# Patient Record
Sex: Female | Born: 2005 | Race: White | Hispanic: No | Marital: Single | Smoking: Never smoker
Health system: Southern US, Community
[De-identification: ages and names within clinical notes are randomized; demographics above are authoritative.]

## PROBLEM LIST (undated history)

## (undated) DIAGNOSIS — F419 Anxiety disorder, unspecified: Secondary | ICD-10-CM

## (undated) DIAGNOSIS — F32A Depression, unspecified: Secondary | ICD-10-CM

## (undated) DIAGNOSIS — F09 Unspecified mental disorder due to known physiological condition: Secondary | ICD-10-CM

## (undated) HISTORY — DX: Depression, unspecified: F32.A

## (undated) HISTORY — DX: Unspecified mental disorder due to known physiological condition: F09

## (undated) HISTORY — DX: Anxiety disorder, unspecified: F41.9

---
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
## 2024-01-14 ENCOUNTER — Ambulatory Visit: Admitting: Pediatrics

## 2024-01-18 LAB — LAB REPORT - SCANNED
EGFR (Non-African Amer.): 134
TSH: 1.6

## 2024-01-23 ENCOUNTER — Telehealth: Payer: Self-pay

## 2024-01-23 NOTE — Telephone Encounter (Signed)
 Copied from CRM #[id number]. Topic: Clinical - Medical Advice >> Jan 23, 2024 12:12 PM Montie POUR wrote: Reason for CRM:  Iola is seeing a new psych doctor and they completed blood work. Mom wants to discuss the results with Dr. Herold. Please call her at [phone]. Laquia also needs to come in for a B 12 shot and mom wants to discuss this with Dr. Herold.

## 2024-01-24 NOTE — Telephone Encounter (Signed)
 Called and spoke with patient informed her that we haven't received labs pt informed me that she will request to get them sent over to us  scheduled patient 8/15 with provider to discuss labs and concerns

## 2024-01-27 ENCOUNTER — Other Ambulatory Visit: Payer: Self-pay

## 2024-01-30 ENCOUNTER — Ambulatory Visit

## 2024-01-30 ENCOUNTER — Encounter: Payer: Self-pay | Admitting: Pediatrics

## 2024-01-30 ENCOUNTER — Ambulatory Visit: Admitting: Pediatrics

## 2024-01-30 VITALS — BP 96/64 | HR 111 | Temp 98.1°F | Wt 138.2 lb

## 2024-01-30 DIAGNOSIS — Z113 Encounter for screening for infections with a predominantly sexual mode of transmission: Secondary | ICD-10-CM

## 2024-01-30 DIAGNOSIS — F4310 Post-traumatic stress disorder, unspecified: Secondary | ICD-10-CM | POA: Diagnosis not present

## 2024-01-30 DIAGNOSIS — L709 Acne, unspecified: Secondary | ICD-10-CM

## 2024-01-30 DIAGNOSIS — E538 Deficiency of other specified B group vitamins: Secondary | ICD-10-CM

## 2024-01-30 DIAGNOSIS — G4700 Insomnia, unspecified: Secondary | ICD-10-CM | POA: Diagnosis not present

## 2024-01-30 DIAGNOSIS — F909 Attention-deficit hyperactivity disorder, unspecified type: Secondary | ICD-10-CM

## 2024-01-30 LAB — WET PREP FOR TRICH, YEAST, CLUE
Clue Cell Exam: POSITIVE — AB
Trichomonas Exam: NEGATIVE
Yeast Exam: NEGATIVE

## 2024-01-30 MED ORDER — ESZOPICLONE 3 MG PO TABS: 3.0000 mg | ORAL_TABLET | Freq: Every day | ORAL | 2 refills | Status: DC

## 2024-01-30 MED ORDER — CYANOCOBALAMIN 1000 MCG/ML IJ SOLN
1000.0000 ug | INTRAMUSCULAR | Status: AC
  Administered 2024-01-30 – 2024-02-13 (×2): 1000 ug via INTRAMUSCULAR

## 2024-01-30 NOTE — Progress Notes (Unsigned)
   Office Visit  BP 96/64   Pulse (!) 111   Temp 98.1 F (36.7 C) (Oral)   Wt 138 lb 3.2 oz (62.7 kg)   SpO2 97%   BMI 23.72 kg/m    Subjective:    Patient ID: Elizabeth Farrell, female    DOB: September 30, 2005, 18 y.o.   MRN: [id number]  HPI: Elizabeth Farrell is a 18 y.o. female  Chief Complaint  Patient presents with  . ADHD    Discussed the use of AI scribe software for clinical note transcription with the patient, who gave verbal consent to proceed.  History of Present Illness     Relevant past medical, surgical, family and social history reviewed and updated as indicated. Interim medical history since our last visit reviewed. Allergies and medications reviewed and updated.  ROS per HPI unless specifically indicated above     Objective:    BP 96/64   Pulse (!) 111   Temp 98.1 F (36.7 C) (Oral)   Wt 138 lb 3.2 oz (62.7 kg)   SpO2 97%   BMI 23.72 kg/m   Wt Readings from Last 3 Encounters:  01/30/24 138 lb 3.2 oz (62.7 kg) (71%, Z= 0.54)*  11/14/23 122 lb (55.3 kg) (44%, Z= -0.16)*  10/24/23 121 lb 12.8 oz (55.2 kg) (43%, Z= -0.17)*   * Growth percentiles are based on CDC (Girls, 2-20 Years) data.     Physical Exam      01/30/2024    8:24 AM 11/14/2023    3:34 PM 10/24/2023    2:03 PM 09/03/2023    3:54 PM 08/15/2023    3:15 PM  Depression screen PHQ 2/9  Decreased Interest 2 2 2 1 2   Down, Depressed, Hopeless 2 1 2 1 2   PHQ - 2 Score 4 3 4 2 4   Altered sleeping 3 3 3 3 3   Tired, decreased energy 3 2 3 2 3   Change in appetite 2 2 1 1 3   Feeling bad or failure about yourself  1 1 1 2 2   Trouble concentrating 2 3 3 3 3   Moving slowly or fidgety/restless 0 0 0 0 0  Suicidal thoughts 0 0 0 0 0  PHQ-9 Score 15 14 15 13 18   Difficult doing work/chores Very difficult Extremely dIfficult Very difficult Very difficult        01/30/2024    8:24 AM 11/14/2023    3:34 PM 10/24/2023    2:03 PM 09/03/2023    3:55 PM  GAD 7 : Generalized Anxiety Score   Nervous, Anxious, on Edge 2 2 2 3   Control/stop worrying 3 2 2 3   Worry too much - different things 3 2 3 3   Trouble relaxing 3 3 2 3   Restless 2 2 3 3   Easily annoyed or irritable 2 3 3 3   Afraid - awful might happen 2 3 2 3   Total GAD 7 Score 17 17 17 21   Anxiety Difficulty Very difficult Extremely difficult Extremely difficult Extremely difficult       Assessment & Plan:  Assessment & Plan   Attention deficit hyperactivity disorder (ADHD), unspecified ADHD type     Assessment and Plan Assessment & Plan      Follow up plan: No follow-ups on file.  Hadassah SHAUNNA Nett, MD

## 2024-01-31 ENCOUNTER — Ambulatory Visit: Payer: Self-pay | Admitting: Pediatrics

## 2024-01-31 ENCOUNTER — Ambulatory Visit: Admitting: Pediatrics

## 2024-01-31 ENCOUNTER — Other Ambulatory Visit: Payer: Self-pay | Admitting: Pediatrics

## 2024-01-31 DIAGNOSIS — B9689 Other specified bacterial agents as the cause of diseases classified elsewhere: Secondary | ICD-10-CM

## 2024-01-31 DIAGNOSIS — B379 Candidiasis, unspecified: Secondary | ICD-10-CM

## 2024-01-31 MED ORDER — METRONIDAZOLE 500 MG PO TABS: 500.0000 mg | ORAL_TABLET | Freq: Two times a day (BID) | ORAL | 0 refills | Status: AC

## 2024-01-31 MED ORDER — FLUCONAZOLE 150 MG PO TABS: 150.0000 mg | ORAL_TABLET | Freq: Once | ORAL | 0 refills | Status: AC

## 2024-02-01 LAB — CHLAMYDIA/GONOCOCCUS/TRICHOMONAS, NAA
Chlamydia by NAA: NEGATIVE
Gonococcus by NAA: NEGATIVE
Trich vag by NAA: NEGATIVE

## 2024-02-04 ENCOUNTER — Encounter: Payer: Self-pay | Admitting: Pediatrics

## 2024-02-04 DIAGNOSIS — E538 Deficiency of other specified B group vitamins: Secondary | ICD-10-CM | POA: Insufficient documentation

## 2024-02-04 DIAGNOSIS — L709 Acne, unspecified: Secondary | ICD-10-CM | POA: Insufficient documentation

## 2024-02-04 NOTE — Assessment & Plan Note (Signed)
 New onset acne, possibly hormonal or related to weight gain. Benzoyl peroxide causing dryness. - Prescribe clindamycin topical. - Recommend gentle cleanser. - Monitor and adjust treatment.

## 2024-02-04 NOTE — Assessment & Plan Note (Signed)
 On Wellbutrin, helps with appetite and energy. Considering dose increase but causes elevated heart rate and dry mouth. She would like a second opinion about a about psychiatrist's approach and potential for ADHD, BPD, or bipolar disorder. Lexapro discussed as addition. Place consult for second opinion. - Discuss Wellbutrin dose increase with psychiatrist. - Consider adding Lexapro.

## 2024-02-04 NOTE — Assessment & Plan Note (Signed)
 Low B12 levels potentially affecting energy and cognition. No anemia present. - Administer B12 injections weekly, then monthly for two months, then every three months. - Monitor B12 levels every 6-8 weeks. - Offer oral B12 supplements if injections not preferred.

## 2024-02-04 NOTE — Assessment & Plan Note (Signed)
 Difficulty sleeping and anxiety upon waking. Lunesta  effective previously. Psychiatrist managing one medication at a time. - Prescribe Lunesta  for sleep.

## 2024-02-05 NOTE — Telephone Encounter (Signed)
 noted

## 2024-02-06 ENCOUNTER — Ambulatory Visit

## 2024-02-07 ENCOUNTER — Encounter: Payer: Self-pay | Admitting: Psychiatry

## 2024-02-12 ENCOUNTER — Ambulatory Visit: Admitting: Pediatrics

## 2024-02-13 ENCOUNTER — Other Ambulatory Visit: Payer: Self-pay | Admitting: Pediatrics

## 2024-02-13 ENCOUNTER — Ambulatory Visit

## 2024-02-13 ENCOUNTER — Telehealth: Payer: Self-pay | Admitting: Pediatrics

## 2024-02-13 DIAGNOSIS — E538 Deficiency of other specified B group vitamins: Secondary | ICD-10-CM | POA: Diagnosis not present

## 2024-02-13 DIAGNOSIS — G4700 Insomnia, unspecified: Secondary | ICD-10-CM

## 2024-02-13 NOTE — Telephone Encounter (Signed)
 Prescription Request  02/13/2024  LOV: 01/30/2024  What is the name of the medication?  Eszopiclone  3 MG TABS   Have you contacted your pharmacy to request a refill? No   Which pharmacy would you like this sent to?  [id number] - [place], [place] - [address] [place] [address] Phone: [phone] Fax: [phone]    Patient notified that their request is being sent to the clinical staff for review and that they should receive a response within 2 business days.   Please advise at Mobile [phone] (mobile)   Patient states she has been taking 2 pills and need refills.

## 2024-02-14 ENCOUNTER — Ambulatory Visit: Admitting: Pediatrics

## 2024-02-21 ENCOUNTER — Other Ambulatory Visit: Payer: Self-pay | Admitting: Pediatrics

## 2024-02-21 DIAGNOSIS — B3731 Acute candidiasis of vulva and vagina: Secondary | ICD-10-CM

## 2024-02-21 MED ORDER — FLUCONAZOLE 150 MG PO TABS: 150.0000 mg | ORAL_TABLET | Freq: Once | ORAL | 0 refills | Status: AC

## 2024-02-21 MED ORDER — ESZOPICLONE 3 MG PO TABS: 3.0000 mg | ORAL_TABLET | Freq: Every day | ORAL | 0 refills | Status: DC

## 2024-02-21 NOTE — Progress Notes (Signed)
 Requesting diflucan  for after abx, sent.  Elizabeth SHAUNNA Nett, MD

## 2024-02-21 NOTE — Progress Notes (Signed)
 Sending lunesta  3mg , early per PDMP but patient misunderstood dosing change and ran out earlier. Will review this further at upcoming visit.  Hadassah SHAUNNA Nett, MD

## 2024-02-26 ENCOUNTER — Ambulatory Visit: Admitting: Nurse Practitioner

## 2024-02-26 ENCOUNTER — Encounter: Payer: Self-pay | Admitting: Nurse Practitioner

## 2024-02-26 VITALS — BP 128/84 | HR 119 | Temp 98.4°F | Wt 136.2 lb

## 2024-02-26 DIAGNOSIS — R0981 Nasal congestion: Secondary | ICD-10-CM

## 2024-02-26 DIAGNOSIS — J069 Acute upper respiratory infection, unspecified: Secondary | ICD-10-CM

## 2024-02-26 DIAGNOSIS — R509 Fever, unspecified: Secondary | ICD-10-CM | POA: Diagnosis not present

## 2024-02-26 NOTE — Progress Notes (Signed)
 BP 128/84   Pulse (!) 119   Temp 98.4 F (36.9 C) (Oral)   Wt 136 lb 3.2 oz (61.8 kg)   SpO2 98%   BMI 23.38 kg/m    Subjective:    Patient ID: Elizabeth Farrell, female    DOB: 02-Jan-2006, 18 y.o.   MRN: [id number]  HPI: Elizabeth Farrell is a 18 y.o. female  Chief Complaint  Patient presents with   URI    Patient states she has been having a fever, congestion, headache, sore throat, cough, and body aches since Sunday evening, Monday morning.    UPPER RESPIRATORY TRACT INFECTION Worst symptom: started Sunday night. Worsened on Monday Fever: Monday 102 and yesterday and 99.3 Cough: yes Shortness of breath: yes Wheezing: yes Chest pain: yes Chest tightness: yes Chest congestion: yes Nasal congestion: yes Runny nose: yes Post nasal drip: yes Sneezing: yes Sore throat: yes Swollen glands: no Sinus pressure: yes Headache: yes Face pain: yes Toothache: no Ear pain: yes bilateral Ear pressure: no bilateral Eyes red/itching:no Eye drainage/crusting: no  Vomiting: no Rash: no Fatigue: yes Sick contacts: yes Strep contacts: no  Context: stable Recurrent sinusitis: no Relief with OTC cold/cough medications: yes  Treatments attempted: cold/sinus and mucinex and tylenol   Relevant past medical, surgical, family and social history reviewed and updated as indicated. Interim medical history since our last visit reviewed. Allergies and medications reviewed and updated.  Review of Systems  Constitutional:  Positive for fatigue and fever.  HENT:  Positive for congestion, postnasal drip, rhinorrhea, sinus pressure, sinus pain, sneezing and sore throat. Negative for dental problem and ear pain.   Respiratory:  Positive for cough, shortness of breath and wheezing.   Cardiovascular:  Positive for chest pain.  Gastrointestinal:  Negative for vomiting.  Skin:  Negative for rash.  Neurological:  Positive for headaches.    Per HPI unless specifically indicated above      Objective:    BP 128/84   Pulse (!) 119   Temp 98.4 F (36.9 C) (Oral)   Wt 136 lb 3.2 oz (61.8 kg)   SpO2 98%   BMI 23.38 kg/m   Wt Readings from Last 3 Encounters:  02/26/24 136 lb 3.2 oz (61.8 kg) (67%, Z= 0.45)*  01/30/24 138 lb 3.2 oz (62.7 kg) (71%, Z= 0.54)*  11/14/23 122 lb (55.3 kg) (44%, Z= -0.16)*   * Growth percentiles are based on CDC (Girls, 2-20 Years) data.    Physical Exam Vitals and nursing note reviewed.  Constitutional:      General: She is not in acute distress.    Appearance: Normal appearance. She is normal weight. She is not ill-appearing, toxic-appearing or diaphoretic.  HENT:     Head: Normocephalic.     Right Ear: External ear normal. A middle ear effusion is present.     Left Ear: External ear normal. A middle ear effusion is present.     Nose: Congestion and rhinorrhea present.     Mouth/Throat:     Mouth: Mucous membranes are moist.     Pharynx: Oropharynx is clear. Posterior oropharyngeal erythema present. No oropharyngeal exudate.  Eyes:     General:        Right eye: No discharge.        Left eye: No discharge.     Extraocular Movements: Extraocular movements intact.     Conjunctiva/sclera: Conjunctivae normal.     Pupils: Pupils are equal, round, and reactive to light.  Cardiovascular:  Rate and Rhythm: Normal rate and regular rhythm.     Heart sounds: No murmur heard. Pulmonary:     Effort: Pulmonary effort is normal. No respiratory distress.     Breath sounds: Normal breath sounds. No wheezing or rales.  Musculoskeletal:     Cervical back: Normal range of motion and neck supple.  Skin:    General: Skin is warm and dry.     Capillary Refill: Capillary refill takes less than 2 seconds.  Neurological:     General: No focal deficit present.     Mental Status: She is alert and oriented to person, place, and time. Mental status is at baseline.  Psychiatric:        Mood and Affect: Mood normal.        Behavior: Behavior normal.         Thought Content: Thought content normal.        Judgment: Judgment normal.     Results for orders placed or performed in visit on 02/07/24  Lab report - scanned   Collection Time: 01/18/24  1:49 PM  Result Value Ref Range   EGFR (Non-African Amer.) 134    TSH 1.600       Assessment & Plan:   Problem List Items Addressed This Visit   None Visit Diagnoses       Viral upper respiratory tract infection    -  Primary   Recommend contuing with over the counter treatment. Making sure to rest and stay well hydrated.  Follow up if symptoms are not improved.     Congestion of nasal sinus       Relevant Orders   Rapid Strep screen([facility])   Influenza A & B (STAT)   Novel Coronavirus, NAA ([facility])     Fever, unspecified fever cause       Relevant Orders   Rapid Strep screen([facility])   Influenza A & B (STAT)   Novel Coronavirus, NAA ([facility])        Follow up plan: No follow-ups on file.

## 2024-02-27 ENCOUNTER — Ambulatory Visit: Payer: Self-pay | Admitting: Nurse Practitioner

## 2024-02-28 ENCOUNTER — Ambulatory Visit: Admitting: Pediatrics

## 2024-02-29 LAB — NOVEL CORONAVIRUS, NAA: SARS-CoV-2, NAA: NOT DETECTED

## 2024-02-29 LAB — CULTURE, GROUP A STREP

## 2024-02-29 LAB — VERITOR FLU A/B WAIVED
Influenza A: NEGATIVE
Influenza B: NEGATIVE

## 2024-02-29 LAB — RAPID STREP SCREEN (MED CTR MEBANE ONLY): Strep Gp A Ag, IA W/Reflex: NEGATIVE

## 2024-02-29 NOTE — Patient Instructions (Incomplete)

## 2024-03-03 ENCOUNTER — Ambulatory Visit: Admitting: Nurse Practitioner

## 2024-03-04 ENCOUNTER — Ambulatory Visit

## 2024-03-11 ENCOUNTER — Ambulatory Visit: Admitting: Pediatrics

## 2024-03-11 VITALS — BP 127/74 | HR 106 | Ht 62.0 in | Wt 135.8 lb

## 2024-03-11 DIAGNOSIS — F909 Attention-deficit hyperactivity disorder, unspecified type: Secondary | ICD-10-CM | POA: Diagnosis not present

## 2024-03-11 DIAGNOSIS — Z113 Encounter for screening for infections with a predominantly sexual mode of transmission: Secondary | ICD-10-CM

## 2024-03-11 DIAGNOSIS — F4310 Post-traumatic stress disorder, unspecified: Secondary | ICD-10-CM | POA: Diagnosis not present

## 2024-03-11 DIAGNOSIS — G4700 Insomnia, unspecified: Secondary | ICD-10-CM | POA: Diagnosis not present

## 2024-03-11 DIAGNOSIS — N941 Unspecified dyspareunia: Secondary | ICD-10-CM

## 2024-03-11 DIAGNOSIS — E538 Deficiency of other specified B group vitamins: Secondary | ICD-10-CM | POA: Diagnosis not present

## 2024-03-11 MED ORDER — DOXEPIN HCL 3 MG PO TABS: 3.0000 mg | ORAL_TABLET | Freq: Every evening | ORAL | 0 refills | Status: DC | PRN

## 2024-03-11 MED ORDER — CYANOCOBALAMIN 1000 MCG/ML IJ SOLN
1000.0000 ug | Freq: Once | INTRAMUSCULAR | Status: AC
  Administered 2024-03-11: 1000 ug via INTRAMUSCULAR

## 2024-03-11 MED ORDER — BUPROPION HCL ER (XL) 300 MG PO TB24: 300.0000 mg | ORAL_TABLET | Freq: Every day | ORAL | 2 refills | Status: DC

## 2024-03-11 NOTE — Patient Instructions (Signed)
 please call [phone] to make an appointment.

## 2024-03-11 NOTE — Progress Notes (Signed)
 Office Visit  BP 127/74 (BP Location: Left Arm, Patient Position: Sitting, Cuff Size: Normal)   Pulse (!) 106   Ht 5' 2 (1.575 m)   Wt 135 lb 12.8 oz (61.6 kg)   SpO2 96%   BMI 24.84 kg/m    Subjective:    Patient ID: Elizabeth Farrell, female    DOB: 2006/01/26, 18 y.o.   MRN: [id number]  HPI: Elizabeth Farrell is a 18 y.o. female  Chief Complaint  Patient presents with   Medication Management   STD screening    Check for BV and yeast also, would like to have test done per urine    Discussed the use of AI scribe software for clinical note transcription with the patient, who gave verbal consent to proceed.  History of Present Illness   Elizabeth Farrell is an 18 year old female who presents with painful intercourse and concerns about medication management for ADHD and sleep issues.  She experiences painful intercourse, describing the pain as occurring 'up inside' and noting it has been present since starting a new relationship. The pain is variable, sometimes less intense, but consistently present during intercourse. She has a history of bacterial vaginosis but has not had other partners since treatment.  She recently had COVID-19, describing the experience as 'absolutely miserable' with residual symptoms such as a persistent cough and congestion. Symptoms began on a Sunday night and were severe throughout the following week.  She is currently taking Wellbutrin  for ADHD at 150 mg twice daily but is concerned about its effectiveness, stating it 'doesn't make me want to do anything' and sometimes causes anxiety. She accidentally took 300 mg once, leading to significant anxiety. She is considering a higher dose or different formulation for better symptom management.  She takes Lunesta  for sleep at 3 mg but reports it is not effective in helping her sleep through the night. She wakes up frequently and feels the medication does not 'knock me out' as expected.  She is concerned  about birth control and its potential long-term effects on fertility, having only had five periods in her lifetime and currently not experiencing regular menstrual cycles. She is on birth control but is contemplating discontinuing it due to safety concerns.  She is due for a B12 shot, having only received two so far.        Relevant past medical, surgical, family and social history reviewed and updated as indicated. Interim medical history since our last visit reviewed. Allergies and medications reviewed and updated.  ROS per HPI unless specifically indicated above     Objective:    BP 127/74 (BP Location: Left Arm, Patient Position: Sitting, Cuff Size: Normal)   Pulse (!) 106   Ht 5' 2 (1.575 m)   Wt 135 lb 12.8 oz (61.6 kg)   SpO2 96%   BMI 24.84 kg/m   Wt Readings from Last 3 Encounters:  03/11/24 135 lb 12.8 oz (61.6 kg) (67%, Z= 0.43)*  02/26/24 136 lb 3.2 oz (61.8 kg) (67%, Z= 0.45)*  01/30/24 138 lb 3.2 oz (62.7 kg) (71%, Z= 0.54)*   * Growth percentiles are based on CDC (Girls, 2-20 Years) data.     Physical Exam Constitutional:      Appearance: Normal appearance.  Pulmonary:     Effort: Pulmonary effort is normal.  Musculoskeletal:        General: Normal range of motion.  Skin:    Comments: Normal skin color  Neurological:     General: No focal deficit  present.     Mental Status: She is alert. Mental status is at baseline.  Psychiatric:        Mood and Affect: Mood normal.        Behavior: Behavior normal.        Thought Content: Thought content normal.         03/11/2024    3:39 PM 01/30/2024    8:24 AM 11/14/2023    3:34 PM 10/24/2023    2:03 PM 09/03/2023    3:54 PM  Depression screen PHQ 2/9  Decreased Interest 2 2 2 2 1   Down, Depressed, Hopeless 2 2 1 2 1   PHQ - 2 Score 4 4 3 4 2   Altered sleeping 2 3 3 3 3   Tired, decreased energy 2 3 2 3 2   Change in appetite 2 2 2 1 1   Feeling bad or failure about yourself  2 1 1 1 2   Trouble concentrating  2 2 3 3 3   Moving slowly or fidgety/restless 1 0 0 0 0  Suicidal thoughts 0 0 0 0 0  PHQ-9 Score 15 15 14 15 13   Difficult doing work/chores  Very difficult Extremely dIfficult Very difficult Very difficult       03/11/2024    3:32 PM 01/30/2024    8:24 AM 11/14/2023    3:34 PM 10/24/2023    2:03 PM  GAD 7 : Generalized Anxiety Score  Nervous, Anxious, on Edge 3 2 2 2   Control/stop worrying 3 3 2 2   Worry too much - different things 3 3 2 3   Trouble relaxing 3 3 3 2   Restless 2 2 2 3   Easily annoyed or irritable 3 2 3 3   Afraid - awful might happen 2 2 3 2   Total GAD 7 Score 19 17 17 17   Anxiety Difficulty  Very difficult Extremely difficult Extremely difficult       Assessment & Plan:  Assessment & Plan   Attention deficit hyperactivity disorder (ADHD), unspecified ADHD type Current Wellbutrin  management not fully effective. Discussed referral to psychiatrist for comprehensive management. Extended release may improve symptoms. - Refer to [facility] for ADHD management. - Prescribe Wellbutrin  extended release 300 mg.  PTSD (post-traumatic stress disorder) On Wellbutrin  immediate release with some improvement. Discussed switching to extended release for consistent dosing and potential symptom improvement. Acknowledged initial anxiety as side effect. - Prescribe Wellbutrin  extended release 300 mg. - Instruct to monitor for increased anxiety and adjust dose if necessary. -     buPROPion  HCl ER (XL); Take 1 tablet (300 mg total) by mouth daily.  Dispense: 30 tablet; Refill: 2  Insomnia, unspecified type Lunesta  3 mg ineffective. Discussed switching to doxepin  for stronger effect and anxiety relief. - Prescribe doxepin , max 6 mg. - Instruct to try doxepin  and report effectiveness. -     Doxepin  HCl; Take 1-2 tablets (3-6 mg total) by mouth at bedtime as needed.  Dispense: 30 tablet; Refill: 0  Dyspareunia in female Intermittent pain during intercourse.  Differential includes fibroids. Birth control unlikely cause. - Order pelvic ultrasound to evaluate for fibroids or abnormalities. - Perform STD testing with urine and swabs. -     US  PELVIC COMPLETE WITH TRANSVAGINAL; Future  B12 deficiency Received two B12 shots. Needs continued monitoring and injections as needed. - Administer B12 injection today. - Check B12 levels in two weeks.  -     Cyanocobalamin   Screen for STD (sexually transmitted disease) -  WET PREP FOR TRICH, YEAST, CLUE -     Chlamydia/Gonococcus/Trichomonas, NAA   Follow up plan: Return in about 4 weeks (around 04/08/2024) for Mood.  Hadassah SHAUNNA Nett, MD

## 2024-03-12 ENCOUNTER — Ambulatory Visit: Payer: Self-pay | Admitting: Pediatrics

## 2024-03-12 LAB — WET PREP FOR TRICH, YEAST, CLUE
Clue Cell Exam: NEGATIVE
Trichomonas Exam: NEGATIVE
Yeast Exam: NEGATIVE

## 2024-03-15 LAB — CHLAMYDIA/GONOCOCCUS/TRICHOMONAS, NAA
Chlamydia by NAA: NEGATIVE
Gonococcus by NAA: NEGATIVE
Trich vag by NAA: NEGATIVE

## 2024-03-18 ENCOUNTER — Ambulatory Visit

## 2024-03-19 ENCOUNTER — Ambulatory Visit: Attending: Pediatrics

## 2024-03-23 ENCOUNTER — Encounter: Payer: Self-pay | Admitting: Pediatrics

## 2024-04-09 ENCOUNTER — Encounter: Payer: Self-pay | Admitting: Pediatrics

## 2024-04-09 ENCOUNTER — Ambulatory Visit: Attending: Pediatrics

## 2024-04-09 ENCOUNTER — Ambulatory Visit: Admitting: Pediatrics

## 2024-04-09 VITALS — BP 122/74 | HR 130 | Temp 98.1°F | Wt 136.2 lb

## 2024-04-09 DIAGNOSIS — R399 Unspecified symptoms and signs involving the genitourinary system: Secondary | ICD-10-CM

## 2024-04-09 DIAGNOSIS — F4310 Post-traumatic stress disorder, unspecified: Secondary | ICD-10-CM

## 2024-04-09 DIAGNOSIS — R002 Palpitations: Secondary | ICD-10-CM | POA: Diagnosis not present

## 2024-04-09 DIAGNOSIS — Z113 Encounter for screening for infections with a predominantly sexual mode of transmission: Secondary | ICD-10-CM

## 2024-04-09 DIAGNOSIS — F5104 Psychophysiologic insomnia: Secondary | ICD-10-CM | POA: Diagnosis not present

## 2024-04-09 DIAGNOSIS — F909 Attention-deficit hyperactivity disorder, unspecified type: Secondary | ICD-10-CM

## 2024-04-09 LAB — URINALYSIS, ROUTINE W REFLEX MICROSCOPIC
Bilirubin, UA: NEGATIVE
Glucose, UA: NEGATIVE
Ketones, UA: NEGATIVE
Nitrite, UA: NEGATIVE
Specific Gravity, UA: 1.025 (ref 1.005–1.030)
Urobilinogen, Ur: 0.2 mg/dL (ref 0.2–1.0)
pH, UA: 6 (ref 5.0–7.5)

## 2024-04-09 LAB — MICROSCOPIC EXAMINATION

## 2024-04-09 LAB — WET PREP FOR TRICH, YEAST, CLUE
Clue Cell Exam: NEGATIVE
Trichomonas Exam: NEGATIVE
Yeast Exam: NEGATIVE

## 2024-04-09 NOTE — Progress Notes (Unsigned)
 Office Visit  BP 122/74   Pulse (!) 130   Temp 98.1 F (36.7 C) (Oral)   Wt 136 lb 3.2 oz (61.8 kg)   SpO2 98%   BMI 24.91 kg/m    Subjective:    Patient ID: Elizabeth Farrell, female    DOB: 07-Jan-2006, 18 y.o.   MRN: [id number]  HPI: Elizabeth Farrell is a 18 y.o. female  Chief Complaint  Patient presents with   ADHD    Wants to discuss sleep medication and possible uti     Discussed the use of AI scribe software for clinical note transcription with the patient, who gave verbal consent to proceed.  History of Present Illness   Elizabeth Farrell is an 18 year old female who presents with palpitations and concerns about a possible UTI.  She experiences daily palpitations, describing them as her heart 'sputtering out of gas.' These episodes can be severe enough to cause significant anxiety, making her question if her heart is still beating. The palpitations have worsened since her past drug use, although she has not used any drugs recently. She previously tried doxepin  but discontinued it due to dissatisfaction and is considering returning to Lunesta  for sleep issues.  She is concerned about a possible urinary tract infection (UTI) after visiting her boyfriend in [place] over the past weekend. She experiences symptoms that lead her to suspect a UTI, although she does not specify the symptoms in detail. She routinely requests testing as a precautionary measure.     Relevant past medical, surgical, family and social history reviewed and updated as indicated. Interim medical history since our last visit reviewed. Allergies and medications reviewed and updated.  ROS per HPI unless specifically indicated above     Objective:    BP 122/74   Pulse (!) 130   Temp 98.1 F (36.7 C) (Oral)   Wt 136 lb 3.2 oz (61.8 kg)   SpO2 98%   BMI 24.91 kg/m   Wt Readings from Last 3 Encounters:  04/09/24 136 lb 3.2 oz (61.8 kg) (67%, Z= 0.44)*  03/11/24 135 lb 12.8 oz (61.6 kg)  (67%, Z= 0.43)*  02/26/24 136 lb 3.2 oz (61.8 kg) (67%, Z= 0.45)*   * Growth percentiles are based on CDC (Girls, 2-20 Years) data.     Physical Exam Constitutional:      Appearance: Normal appearance.  Pulmonary:     Effort: Pulmonary effort is normal.  Musculoskeletal:        General: Normal range of motion.  Skin:    Comments: Normal skin color  Neurological:     General: No focal deficit present.     Mental Status: She is alert. Mental status is at baseline.  Psychiatric:        Mood and Affect: Mood normal.        Behavior: Behavior normal.        Thought Content: Thought content normal.         04/09/2024   11:29 AM 03/11/2024    3:39 PM 01/30/2024    8:24 AM 11/14/2023    3:34 PM 10/24/2023    2:03 PM  Depression screen PHQ 2/9  Decreased Interest 1 2 2 2 2   Down, Depressed, Hopeless 1 2 2 1 2   PHQ - 2 Score 2 4 4 3 4   Altered sleeping 2 2 3 3 3   Tired, decreased energy 1 2 3 2 3   Change in appetite 1 2 2 2 1   Feeling bad or failure about  yourself  1 2 1 1 1   Trouble concentrating 2 2 2 3 3   Moving slowly or fidgety/restless 0 1 0 0 0  Suicidal thoughts 0 0 0 0 0  PHQ-9 Score 9 15 15 14 15   Difficult doing work/chores Very difficult  Very difficult Extremely dIfficult Very difficult       04/09/2024   11:29 AM 03/11/2024    3:32 PM 01/30/2024    8:24 AM 11/14/2023    3:34 PM  GAD 7 : Generalized Anxiety Score  Nervous, Anxious, on Edge 2 3 2 2   Control/stop worrying 3 3 3 2   Worry too much - different things 3 3 3 2   Trouble relaxing 3 3 3 3   Restless 1 2 2 2   Easily annoyed or irritable 2 3 2 3   Afraid - awful might happen 2 2 2 3   Total GAD 7 Score 16 19 17 17   Anxiety Difficulty Extremely difficult  Very difficult Extremely difficult       Assessment & Plan:  Assessment & Plan   Urinary tract infection symptoms Reports possible UTI symptoms and requests STD screening. - Perform STD screening and urinalysis.  -     Urinalysis, Routine w reflex  microscopic -     Urine Culture -     Microscopic Examination  Palpitations Tachycardic today with h/o palpitations. 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 Reports daily palpitations with slightly elevated heart rate. Wellbutrin  may be contributing.  - Order Holter monitor for one week. -     LONG TERM MONITOR (3-14 DAYS); Future  Screen for STD (sexually transmitted disease) -     WET PREP FOR TRICH, YEAST, CLUE -     Chlamydia/Gonococcus/Trichomonas, NAA  Insomnia Requesting lunesta  refills. Emphasized max dose 3mg  at bedtime. New psych apt pending.   Follow up plan: Return in about 3 weeks (around 04/30/2024) for Mood.  Elizabeth SHAUNNA Nett, MD

## 2024-04-09 NOTE — Patient Instructions (Addendum)
 To schedule with a new psychiatrist: please call [phone] to make an appointment.

## 2024-04-10 ENCOUNTER — Ambulatory Visit: Payer: Self-pay | Admitting: Pediatrics

## 2024-04-10 DIAGNOSIS — N3000 Acute cystitis without hematuria: Secondary | ICD-10-CM

## 2024-04-10 DIAGNOSIS — B379 Candidiasis, unspecified: Secondary | ICD-10-CM

## 2024-04-11 LAB — CHLAMYDIA/GONOCOCCUS/TRICHOMONAS, NAA
Chlamydia by NAA: NEGATIVE
Gonococcus by NAA: NEGATIVE
Trich vag by NAA: NEGATIVE

## 2024-04-13 LAB — URINE CULTURE

## 2024-04-14 MED ORDER — NITROFURANTOIN MONOHYD MACRO 100 MG PO CAPS: 100.0000 mg | ORAL_CAPSULE | Freq: Two times a day (BID) | ORAL | 0 refills | Status: AC

## 2024-04-14 MED ORDER — FLUCONAZOLE 150 MG PO TABS: 150.0000 mg | ORAL_TABLET | Freq: Once | ORAL | 0 refills | Status: AC

## 2024-04-16 ENCOUNTER — Ambulatory Visit: Attending: Pediatrics

## 2024-04-16 ENCOUNTER — Other Ambulatory Visit: Payer: Self-pay | Admitting: Pediatrics

## 2024-04-16 ENCOUNTER — Encounter: Payer: Self-pay | Admitting: Pediatrics

## 2024-04-16 DIAGNOSIS — R002 Palpitations: Secondary | ICD-10-CM

## 2024-04-16 DIAGNOSIS — N3000 Acute cystitis without hematuria: Secondary | ICD-10-CM

## 2024-04-16 MED ORDER — ESZOPICLONE 3 MG PO TABS: 3.0000 mg | ORAL_TABLET | Freq: Every day | ORAL | 0 refills | Status: DC

## 2024-04-16 MED ORDER — CIPROFLOXACIN HCL 500 MG PO TABS: 500.0000 mg | ORAL_TABLET | Freq: Two times a day (BID) | ORAL | 0 refills | Status: AC

## 2024-04-16 NOTE — Progress Notes (Signed)
 Replacing holter order  Elizabeth SHAUNNA Nett, MD

## 2024-04-16 NOTE — Progress Notes (Signed)
 Switching macrobid to cipro. Pt messaged about kidney pain and ongoing fevers so suspect needs tx for complicated UTI. Given return and return precautions via mychart.  Elizabeth SHAUNNA Nett, MD

## 2024-04-17 ENCOUNTER — Ambulatory Visit: Payer: Self-pay

## 2024-04-17 NOTE — Telephone Encounter (Signed)
  FYI Only or Action Required?: Action required by provider: request for appointment.  Patient was last seen in primary care on 04/09/2024 by Herold Hadassah SQUIBB, MD.  Called Nurse Triage reporting Medication Problem.  Symptoms began a week ago.  Interventions attempted: Prescription medications: Macrobid, Cipro.  Symptoms are: gradually worsening.Mother asking for Dr. Herold to work pt. In today. Pt. Continues to vomiting on Cipro. No availabilities in the practice, declines going to another office. Mom asking to be worked in today. Please advise Mom.  Triage Disposition: Call PCP Now  Patient/caregiver understands and will follow disposition?: Yes    Copied from CRM #[id number]. Topic: Clinical - Red Word Triage >> Apr 17, 2024 11:11 AM Kevelyn M wrote: Red Word that prompted transfer to Nurse Triage: Patient diagnosed with UTI. Prescribed Macrobid and then she was having back pain and kidney pain and running a fever, and body aches. Then she was prescribed Cipro and now she's being throwing up for 3 days. Reason for Disposition  [1] Caller has URGENT medicine question about med that primary care doctor (or NP/PA) or specialist prescribed AND [2] triager unable to answer question  Answer Assessment - Initial Assessment Questions 1. NAME of MEDICINE: What medicine(s) are you calling about?     Macrobid, Cipro 2. QUESTION: What is your question? (e.g., double dose of medicine, side effect)     Causing vomiting 3. PRESCRIBER: Who prescribed the medicine? Reason: if prescribed by specialist, call should be referred to that group.     [facility] 4. SYMPTOMS: Do you have any symptoms? If Yes, ask: What symptoms are you having?  How bad are the symptoms (e.g., mild, moderate, severe)     yes 5. PREGNANCY:  Is there any chance that you are pregnant? When was your last menstrual period?     no  Protocols used: Medication Question Call-A-AH

## 2024-04-20 NOTE — Telephone Encounter (Signed)
 Returned call to patient. She advises that she really is feeling much better. She explains that other than being a bit tired still she can keep food and fluids down. She appreciated the call to check in on her.

## 2024-04-22 NOTE — Telephone Encounter (Signed)
 Moved to 11/13

## 2024-04-30 ENCOUNTER — Ambulatory Visit: Admitting: Pediatrics

## 2024-05-06 ENCOUNTER — Encounter: Payer: Self-pay | Admitting: Pediatrics

## 2024-05-06 ENCOUNTER — Ambulatory Visit: Admitting: Pediatrics

## 2024-05-06 VITALS — BP 110/71 | HR 122 | Temp 98.6°F | Ht 62.0 in | Wt 137.0 lb

## 2024-05-06 DIAGNOSIS — F4310 Post-traumatic stress disorder, unspecified: Secondary | ICD-10-CM

## 2024-05-06 DIAGNOSIS — F5104 Psychophysiologic insomnia: Secondary | ICD-10-CM

## 2024-05-06 DIAGNOSIS — R000 Tachycardia, unspecified: Secondary | ICD-10-CM | POA: Diagnosis not present

## 2024-05-06 MED ORDER — BUPROPION HCL ER (XL) 300 MG PO TB24: 300.0000 mg | ORAL_TABLET | Freq: Every day | ORAL | 1 refills | Status: AC

## 2024-05-06 MED ORDER — ESZOPICLONE 3 MG PO TABS: 3.0000 mg | ORAL_TABLET | Freq: Every day | ORAL | 2 refills | Status: AC

## 2024-05-06 NOTE — Assessment & Plan Note (Signed)
 Patient has not re-established with psych. Notes that welbutrin is working well. I am concerned may be causing or contributing to her tachycardia so if ongoing HR issues may need to look for alternative.

## 2024-05-06 NOTE — Assessment & Plan Note (Signed)
-   Continue current dose of Lunesta  and Wellbutrin . - Provided two refills for Lunesta .

## 2024-05-06 NOTE — Progress Notes (Signed)
 Office Visit  BP 110/71   Pulse (!) 122   Temp 98.6 F (37 C) (Oral)   Ht 5' 2 (1.575 m)   Wt 137 lb (62.1 kg)   SpO2 98%   BMI 25.06 kg/m    Subjective:    Patient ID: Elizabeth Farrell, female    DOB: 2005/08/23, 18 y.o.   MRN: [id number]  HPI: Elizabeth Farrell is a 18 y.o. female  Chief Complaint  Patient presents with   ADHD   Post-Traumatic Stress Disorder    Discussed the use of AI scribe software for clinical note transcription with the patient, who gave verbal consent to proceed.  History of Present Illness   Elizabeth Farrell is a 18 year old female who presents for medication management and follow-up.  She is currently taking Lunesta  and Wellbutrin , which she finds effective at her current doses. She requests refills for both medications as she is running low.  She notes an elevated heart rate today, attributing it to coffee consumption and the effects of Wellbutrin . She has not yet worn the replacement heart monitor due to a busy schedule and recent birthday celebrations.  She has not established care with a new psychiatrist, citing difficulty in finding a suitable provider and a general backlog in appointments.        Relevant past medical, surgical, family and social history reviewed and updated as indicated. Interim medical history since our last visit reviewed. Allergies and medications reviewed and updated.  ROS per HPI unless specifically indicated above     Objective:    BP 110/71   Pulse (!) 122   Temp 98.6 F (37 C) (Oral)   Ht 5' 2 (1.575 m)   Wt 137 lb (62.1 kg)   SpO2 98%   BMI 25.06 kg/m   Wt Readings from Last 3 Encounters:  05/06/24 137 lb (62.1 kg) (68%, Z= 0.46)*  04/09/24 136 lb 3.2 oz (61.8 kg) (67%, Z= 0.44)*  03/11/24 135 lb 12.8 oz (61.6 kg) (67%, Z= 0.43)*   * Growth percentiles are based on CDC (Girls, 2-20 Years) data.     Physical Exam      05/06/2024    1:32 PM 04/09/2024   11:29 AM 03/11/2024    3:39  PM 01/30/2024    8:24 AM 11/14/2023    3:34 PM  Depression screen PHQ 2/9  Decreased Interest 1 1 2 2 2   Down, Depressed, Hopeless 1 1 2 2 1   PHQ - 2 Score 2 2 4 4 3   Altered sleeping 1 2 2 3 3   Tired, decreased energy 3 1 2 3 2   Change in appetite 2 1 2 2 2   Feeling bad or failure about yourself  1 1 2 1 1   Trouble concentrating 1 2 2 2 3   Moving slowly or fidgety/restless 1 0 1 0 0  Suicidal thoughts 0 0 0 0 0  PHQ-9 Score 11 9  15  15  14    Difficult doing work/chores Somewhat difficult Very difficult  Very difficult Extremely dIfficult     Data saved with a previous flowsheet row definition       05/06/2024    1:32 PM 04/09/2024   11:29 AM 03/11/2024    3:32 PM 01/30/2024    8:24 AM  GAD 7 : Generalized Anxiety Score  Nervous, Anxious, on Edge 2 2 3 2   Control/stop worrying 2 3 3 3   Worry too much - different things 2 3 3 3   Trouble  relaxing 2 3 3 3   Restless 2 1 2 2   Easily annoyed or irritable 2 2 3 2   Afraid - awful might happen 2 2 2 2   Total GAD 7 Score 14 16 19 17   Anxiety Difficulty Very difficult Extremely difficult  Very difficult       Assessment & Plan:  Assessment & Plan   PTSD (post-traumatic stress disorder) Assessment & Plan: Patient has not re-established with psych. Notes that welbutrin is working well. I am concerned may be causing or contributing to her tachycardia so if ongoing HR issues may need to look for alternative.  Orders: -     buPROPion  HCl ER (XL); Take 1 tablet (300 mg total) by mouth daily.  Dispense: 90 tablet; Refill: 1  Psychophysiological insomnia Assessment & Plan: - Continue current dose of Lunesta  and Wellbutrin . - Provided two refills for Lunesta .   Orders: -     Eszopiclone ; Take 1 tablet (3 mg total) by mouth at bedtime. Take immediately before bedtime  Dispense: 30 tablet; Refill: 2  Tachycardia Pending holter results. She attributes today's number to drinking a lot of caffeine. Suspect above also may contribute. If  ongoing may need UDS but declines current substance use.   Follow up plan: Return in about 2 months (around 07/06/2024).  Hadassah SHAUNNA Nett, MD

## 2024-05-26 ENCOUNTER — Ambulatory Visit: Admitting: Pediatrics

## 2024-05-26 ENCOUNTER — Encounter: Payer: Self-pay | Admitting: Pediatrics

## 2024-05-26 VITALS — BP 111/76 | HR 90 | Temp 98.3°F | Ht 62.0 in | Wt 136.2 lb

## 2024-05-26 DIAGNOSIS — E538 Deficiency of other specified B group vitamins: Secondary | ICD-10-CM

## 2024-05-26 DIAGNOSIS — N898 Other specified noninflammatory disorders of vagina: Secondary | ICD-10-CM

## 2024-05-26 DIAGNOSIS — Z13220 Encounter for screening for lipoid disorders: Secondary | ICD-10-CM

## 2024-05-26 DIAGNOSIS — Z113 Encounter for screening for infections with a predominantly sexual mode of transmission: Secondary | ICD-10-CM

## 2024-05-26 DIAGNOSIS — N926 Irregular menstruation, unspecified: Secondary | ICD-10-CM

## 2024-05-26 DIAGNOSIS — F410 Panic disorder [episodic paroxysmal anxiety] without agoraphobia: Secondary | ICD-10-CM

## 2024-05-26 LAB — URINALYSIS, ROUTINE W REFLEX MICROSCOPIC
Bilirubin, UA: NEGATIVE
Glucose, UA: NEGATIVE
Ketones, UA: NEGATIVE
Leukocytes,UA: NEGATIVE
Nitrite, UA: NEGATIVE
Protein,UA: NEGATIVE
RBC, UA: NEGATIVE
Specific Gravity, UA: 1.01 (ref 1.005–1.030)
Urobilinogen, Ur: 0.2 mg/dL (ref 0.2–1.0)
pH, UA: 7 (ref 5.0–7.5)

## 2024-05-26 LAB — WET PREP FOR TRICH, YEAST, CLUE
Clue Cell Exam: NEGATIVE
Trichomonas Exam: NEGATIVE
Yeast Exam: NEGATIVE

## 2024-05-26 MED ORDER — PROPRANOLOL HCL 10 MG PO TABS: 10.0000 mg | ORAL_TABLET | Freq: Three times a day (TID) | ORAL | 1 refills | Status: AC | PRN

## 2024-05-26 NOTE — Progress Notes (Unsigned)
 Office Visit  BP 111/76   Pulse 90   Temp 98.3 F (36.8 C) (Oral)   Ht 5' 2 (1.575 m)   Wt 136 lb 3.2 oz (61.8 kg)   SpO2 98%   BMI 24.91 kg/m    Subjective:    Patient ID: Elizabeth Farrell, female    DOB: 12-29-2005, 18 y.o.   MRN: [id number]  HPI: Elizabeth Farrell is a 18 y.o. female  Chief Complaint  Patient presents with   STD Labs    Patient states she would like to have a full STD panel done today. No known exposures that she is aware of.     Discussed the use of AI scribe software for clinical note transcription with the patient, who gave verbal consent to proceed.  History of Present Illness   Wt Readings from Last 3 Encounters:  05/26/24 136 lb 3.2 oz (61.8 kg) (66%, Z= 0.43)*  05/06/24 137 lb (62.1 kg) (68%, Z= 0.46)*  04/09/24 136 lb 3.2 oz (61.8 kg) (67%, Z= 0.44)*   * Growth percentiles are based on CDC (Girls, 2-20 Years) data.   q  Relevant past medical, surgical, family and social history reviewed and updated as indicated. Interim medical history since our last visit reviewed. Allergies and medications reviewed and updated.  ROS per HPI unless specifically indicated above     Objective:    BP 111/76   Pulse 90   Temp 98.3 F (36.8 C) (Oral)   Ht 5' 2 (1.575 m)   Wt 136 lb 3.2 oz (61.8 kg)   SpO2 98%   BMI 24.91 kg/m   Wt Readings from Last 3 Encounters:  05/26/24 136 lb 3.2 oz (61.8 kg) (66%, Z= 0.43)*  05/06/24 137 lb (62.1 kg) (68%, Z= 0.46)*  04/09/24 136 lb 3.2 oz (61.8 kg) (67%, Z= 0.44)*   * Growth percentiles are based on CDC (Girls, 2-20 Years) data.     Physical Exam      05/06/2024    1:32 PM 04/09/2024   11:29 AM 03/11/2024    3:39 PM 01/30/2024    8:24 AM 11/14/2023    3:34 PM  Depression screen PHQ 2/9  Decreased Interest 1 1 2 2 2   Down, Depressed, Hopeless 1 1 2 2 1   PHQ - 2 Score 2 2 4 4 3   Altered sleeping 1 2 2 3 3   Tired, decreased energy 3 1 2 3 2   Change in appetite 2 1 2 2 2   Feeling bad or  failure about yourself  1 1 2 1 1   Trouble concentrating 1 2 2 2 3   Moving slowly or fidgety/restless 1 0 1 0 0  Suicidal thoughts 0 0 0 0 0  PHQ-9 Score 11 9  15  15  14    Difficult doing work/chores Somewhat difficult Very difficult  Very difficult Extremely dIfficult     Data saved with a previous flowsheet row definition       05/06/2024    1:32 PM 04/09/2024   11:29 AM 03/11/2024    3:32 PM 01/30/2024    8:24 AM  GAD 7 : Generalized Anxiety Score  Nervous, Anxious, on Edge 2 2 3 2   Control/stop worrying 2 3 3 3   Worry too much - different things 2 3 3 3   Trouble relaxing 2 3 3 3   Restless 2 1 2 2   Easily annoyed or irritable 2 2 3 2   Afraid - awful might happen 2 2 2  2  Total GAD 7 Score 14 16 19 17   Anxiety Difficulty Very difficult Extremely difficult  Very difficult       Assessment & Plan:  Assessment & Plan   Vaginal itching -     WET PREP FOR TRICH, YEAST, CLUE -     Urinalysis, Routine w reflex microscopic -     Urine Culture -     Chlamydia/Gonococcus/Trichomonas, NAA  Screen for STD (sexually transmitted disease) -     RPR w/reflex to TrepSure -     HIV Antibody (routine testing w rflx)  B12 deficiency -     Vitamin B12 -     CBC with Differential/Platelet  Lipid screening -     Comprehensive metabolic panel with GFR -     Hemoglobin A1c -     Lipid panel  Panic attacks -     Propranolol  HCl; Take 1 tablet (10 mg total) by mouth 3 (three) times daily as needed.  Dispense: 90 tablet; Refill: 1 -     TSH     Assessment and Plan Assessment & Plan      Follow up plan: Return in about 3 months (around 08/24/2024).  Hadassah SHAUNNA Nett, MD

## 2024-05-27 ENCOUNTER — Encounter: Payer: Self-pay | Admitting: Pediatrics

## 2024-05-27 ENCOUNTER — Ambulatory Visit: Payer: Self-pay | Admitting: Pediatrics

## 2024-05-27 DIAGNOSIS — R7989 Other specified abnormal findings of blood chemistry: Secondary | ICD-10-CM

## 2024-05-27 NOTE — Assessment & Plan Note (Signed)
Due for repeat check.

## 2024-05-27 NOTE — Assessment & Plan Note (Signed)
 Intermittent panic attacks with physical symptoms exacerbated by social situations and at night. Previous treatments ineffective. Propranolol  considered for cardiac symptoms without dependency risk. - Prescribed propranolol  as needed for panic attacks, up to three times daily. - Instructed to message if propranolol  is ineffective for further management options. - Discussed potential alternative treatment with gabapentin if propranolol  is ineffective.

## 2024-05-28 LAB — CHLAMYDIA/GONOCOCCUS/TRICHOMONAS, NAA
Chlamydia by NAA: NEGATIVE
Gonococcus by NAA: NEGATIVE
Trich vag by NAA: NEGATIVE

## 2024-05-28 LAB — URINE CULTURE: Organism ID, Bacteria: NO GROWTH

## 2024-05-29 LAB — CBC WITH DIFFERENTIAL/PLATELET
Basophils Absolute: 0.1 x10E3/uL (ref 0.0–0.2)
Basos: 1 %
EOS (ABSOLUTE): 0.3 x10E3/uL (ref 0.0–0.4)
Eos: 3 %
Hematocrit: 48 % — ABNORMAL HIGH (ref 34.0–46.6)
Hemoglobin: 15.9 g/dL (ref 11.1–15.9)
Immature Grans (Abs): 0 x10E3/uL (ref 0.0–0.1)
Immature Granulocytes: 0 %
Lymphocytes Absolute: 2.6 x10E3/uL (ref 0.7–3.1)
Lymphs: 27 %
MCH: 30.9 pg (ref 26.6–33.0)
MCHC: 33.1 g/dL (ref 31.5–35.7)
MCV: 93 fL (ref 79–97)
Monocytes Absolute: 0.5 x10E3/uL (ref 0.1–0.9)
Monocytes: 5 %
Neutrophils Absolute: 6.1 x10E3/uL (ref 1.4–7.0)
Neutrophils: 64 %
Platelets: 420 x10E3/uL (ref 150–450)
RBC: 5.14 x10E6/uL (ref 3.77–5.28)
RDW: 12.2 % (ref 11.7–15.4)
WBC: 9.5 x10E3/uL (ref 3.4–10.8)

## 2024-05-29 LAB — LIPID PANEL
Chol/HDL Ratio: 2.8 ratio (ref 0.0–4.4)
Cholesterol, Total: 162 mg/dL (ref 100–169)
HDL: 57 mg/dL (ref >39)
LDL Chol Calc (NIH): 88 mg/dL (ref 0–109)
Triglycerides: 89 mg/dL (ref 0–89)
VLDL Cholesterol Cal: 17 mg/dL (ref 5–40)

## 2024-05-29 LAB — COMPREHENSIVE METABOLIC PANEL WITH GFR
ALT: 38 IU/L — ABNORMAL HIGH (ref 0–32)
AST: 88 IU/L — ABNORMAL HIGH (ref 0–40)
Albumin: 5.1 g/dL — ABNORMAL HIGH (ref 4.0–5.0)
Alkaline Phosphatase: 85 IU/L (ref 42–106)
BUN/Creatinine Ratio: 12 (ref 9–23)
BUN: 8 mg/dL (ref 6–20)
Bilirubin Total: 0.4 mg/dL (ref 0.0–1.2)
CO2: 19 mmol/L — ABNORMAL LOW (ref 20–29)
Calcium: 10.1 mg/dL (ref 8.7–10.2)
Chloride: 100 mmol/L (ref 96–106)
Creatinine, Ser: 0.65 mg/dL (ref 0.57–1.00)
Globulin, Total: 2.6 g/dL (ref 1.5–4.5)
Glucose: 84 mg/dL (ref 70–99)
Potassium: 4.4 mmol/L (ref 3.5–5.2)
Sodium: 136 mmol/L (ref 134–144)
Total Protein: 7.7 g/dL (ref 6.0–8.5)
eGFR: 130 mL/min/1.73 (ref >59)

## 2024-05-29 LAB — HEMOGLOBIN A1C
Est. average glucose Bld gHb Est-mCnc: 94 mg/dL
Hgb A1c MFr Bld: 4.9 % (ref 4.8–5.6)

## 2024-05-29 LAB — TSH+PRL+TESTT+TESTF+17OHP
17-Hydroxyprogesterone: 54 ng/dL
Prolactin: 8.1 ng/mL (ref 4.8–33.4)
TSH: 1.04 u[IU]/mL (ref 0.450–4.500)
Testosterone, Free: 3.9 pg/mL
Testosterone, Total, LC/MS: 50 ng/dL (ref 10.0–55.0)

## 2024-05-29 LAB — TREPONEMAL ANTIBODIES, TPPA: Treponemal Antibodies, TPPA: NONREACTIVE

## 2024-05-29 LAB — VITAMIN B12: Vitamin B-12: 328 pg/mL (ref 232–1245)

## 2024-05-29 LAB — HIV ANTIBODY (ROUTINE TESTING W REFLEX): HIV Screen 4th Generation wRfx: NONREACTIVE

## 2024-05-29 LAB — RPR W/REFLEX TO TREPSURE: RPR: NONREACTIVE

## 2024-06-03 ENCOUNTER — Other Ambulatory Visit

## 2024-06-03 ENCOUNTER — Ambulatory Visit: Admitting: Pediatrics

## 2024-06-03 DIAGNOSIS — R7989 Other specified abnormal findings of blood chemistry: Secondary | ICD-10-CM

## 2024-06-04 ENCOUNTER — Ambulatory Visit
Admission: RE | Admit: 2024-06-04 | Discharge: 2024-06-04 | Disposition: A | Source: Ambulatory Visit | Attending: Pediatrics | Admitting: Pediatrics

## 2024-06-04 DIAGNOSIS — R7989 Other specified abnormal findings of blood chemistry: Secondary | ICD-10-CM | POA: Insufficient documentation

## 2024-06-07 ENCOUNTER — Ambulatory Visit: Payer: Self-pay | Admitting: Pediatrics

## 2024-06-08 LAB — HEPATIC FUNCTION PANEL
ALT: 10 IU/L (ref 0–32)
AST: 15 IU/L (ref 0–40)
Albumin: 5 g/dL (ref 4.0–5.0)
Alkaline Phosphatase: 82 IU/L (ref 42–106)
Bilirubin Total: 0.6 mg/dL (ref 0.0–1.2)
Bilirubin, Direct: 0.19 mg/dL (ref 0.00–0.40)
Total Protein: 7.5 g/dL (ref 6.0–8.5)

## 2024-06-08 LAB — ANTI-SMOOTH MUSCLE ANTIBODY, IGG: Smooth Muscle Ab: 6 U (ref 0–19)

## 2024-06-08 LAB — ANA: Anti Nuclear Antibody (ANA): NEGATIVE

## 2024-06-08 LAB — IGG: IgG (Immunoglobin G), Serum: 956 mg/dL (ref 719–1475)

## 2024-06-30 ENCOUNTER — Encounter: Payer: Self-pay | Admitting: Nurse Practitioner

## 2024-07-11 NOTE — Patient Instructions (Incomplete)

## 2024-07-13 ENCOUNTER — Ambulatory Visit: Admitting: Nurse Practitioner

## 2024-08-24 ENCOUNTER — Ambulatory Visit: Admitting: Nurse Practitioner
